# Patient Record
Sex: Female | Born: 1970 | Race: Black or African American | Hispanic: No | Marital: Married | Smoking: Never smoker
Health system: Southern US, Community
[De-identification: ages and names within clinical notes are randomized; demographics above are authoritative.]

## PROBLEM LIST (undated history)

## (undated) DIAGNOSIS — J302 Other seasonal allergic rhinitis: Secondary | ICD-10-CM

## (undated) DIAGNOSIS — E119 Type 2 diabetes mellitus without complications: Secondary | ICD-10-CM

## (undated) HISTORY — DX: Type 2 diabetes mellitus without complications: E11.9

---

## 2010-12-31 ENCOUNTER — Other Ambulatory Visit: Payer: Self-pay | Admitting: Family Medicine

## 2010-12-31 DIAGNOSIS — R1011 Right upper quadrant pain: Secondary | ICD-10-CM

## 2011-01-01 ENCOUNTER — Ambulatory Visit
Admission: RE | Admit: 2011-01-01 | Discharge: 2011-01-01 | Disposition: A | Discharge status: Home / Self Care | Payer: Managed Care, Other (non HMO) | Source: Ambulatory Visit | Attending: Family Medicine | Admitting: Family Medicine

## 2011-01-01 DIAGNOSIS — R1011 Right upper quadrant pain: Secondary | ICD-10-CM

## 2012-02-16 IMAGING — US US ABDOMEN COMPLETE
1 series · 14 of 25 positions shown · non-contrast
Comparison: None.

CLINICAL DATA: Right upper quadrant abdominal pain

COMPLETE ABDOMINAL ULTRASOUND

[Series 1: us abdomen complete · 0.32mm/px · 14 of 86 slices shown]
[im 1/86]
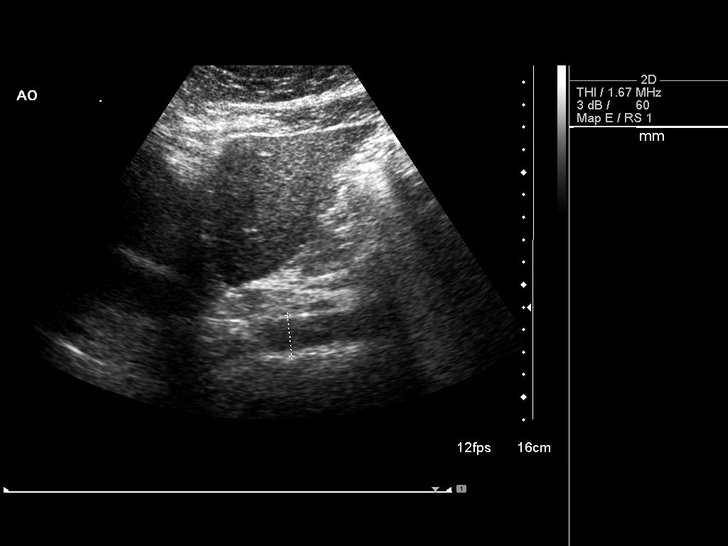
[im 8/86]
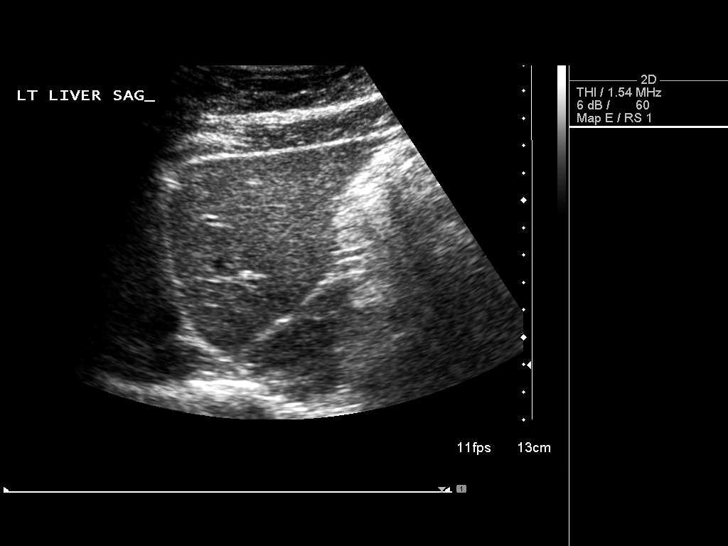
[im 15/86]
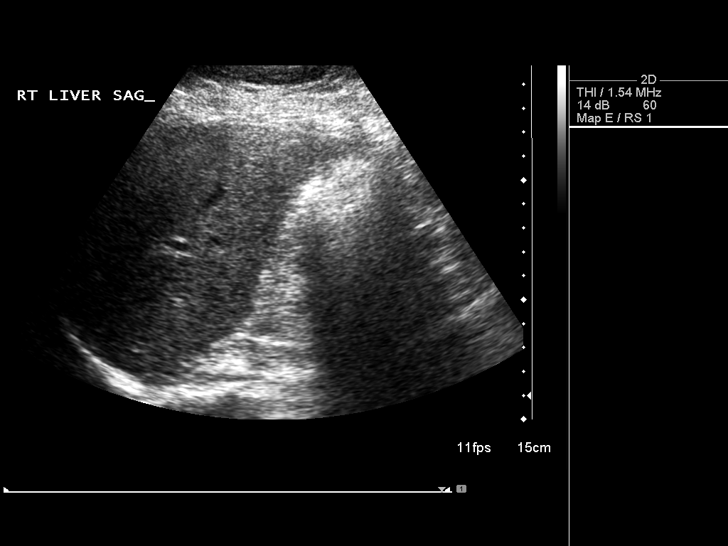
[im 22/86]
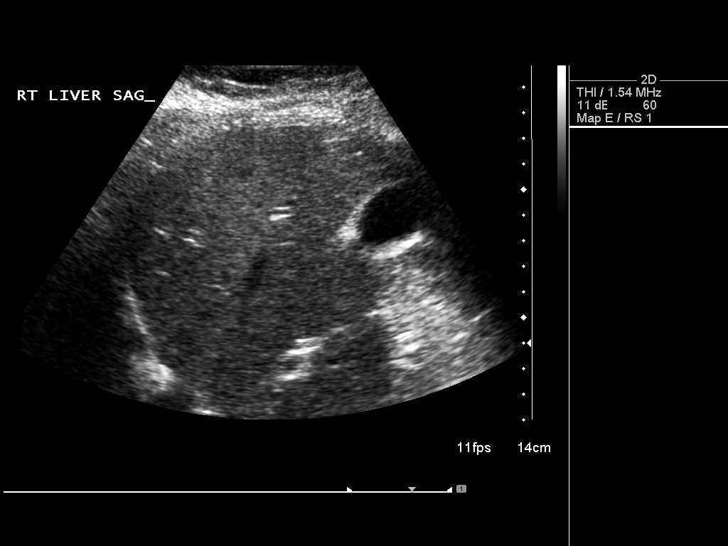
[im 29/86]
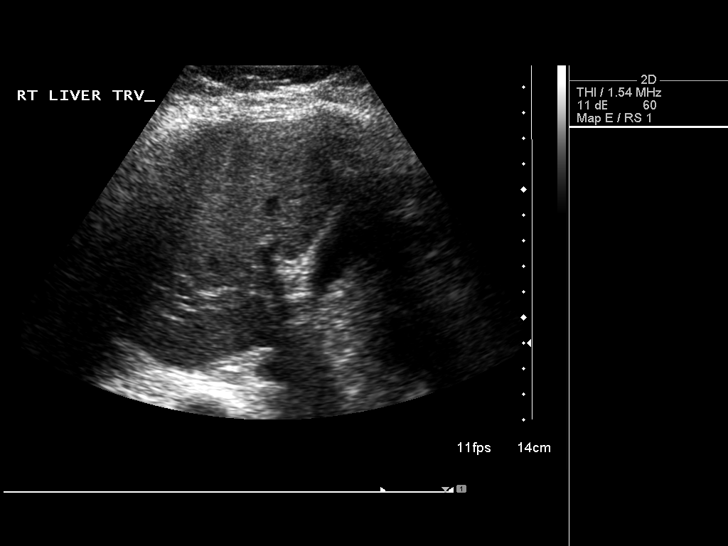
[im 32/86]
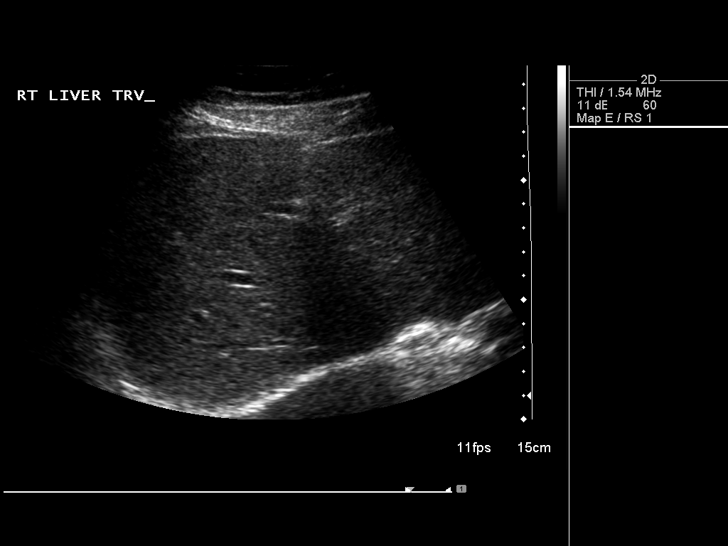
[im 39/86]
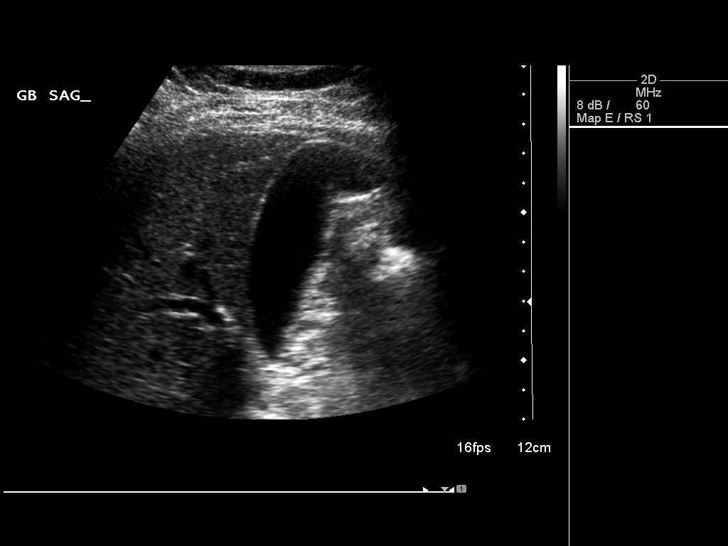
[im 47/86]
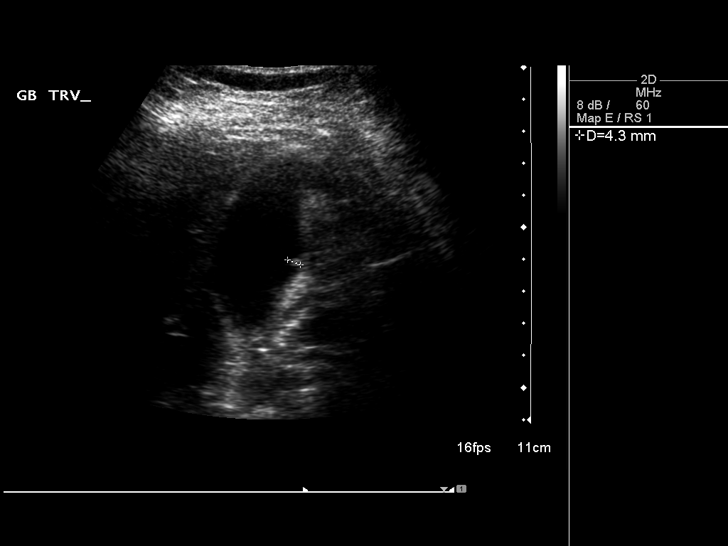
[im 54/86]
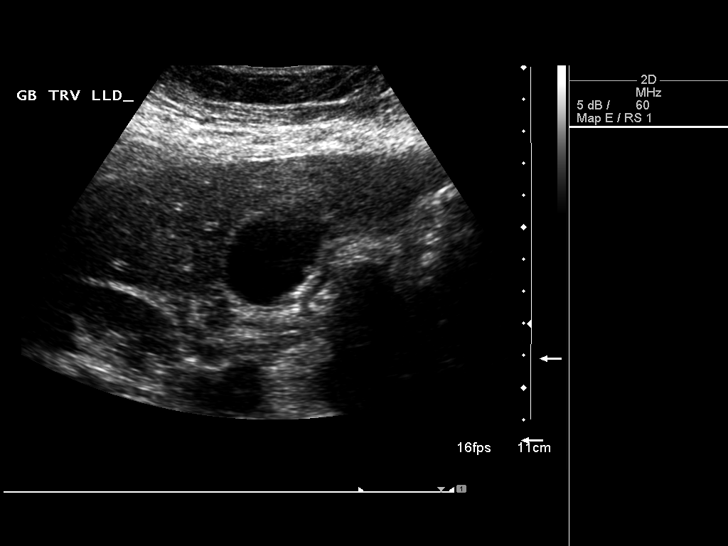
[im 57/86]
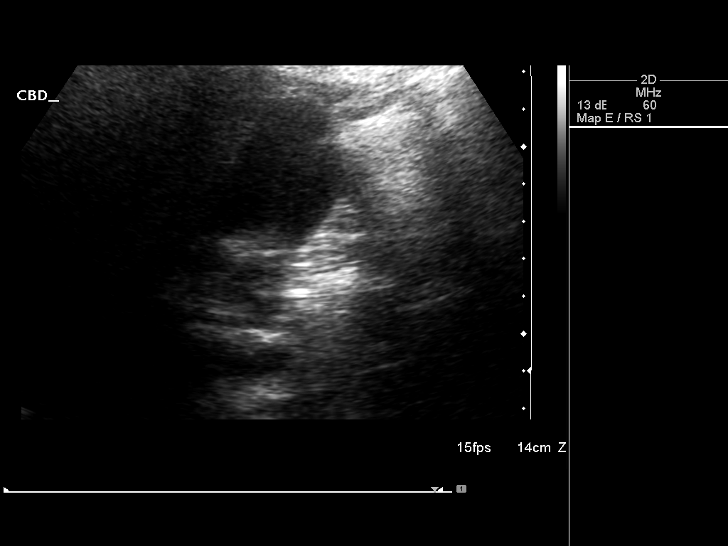
[im 64/86]
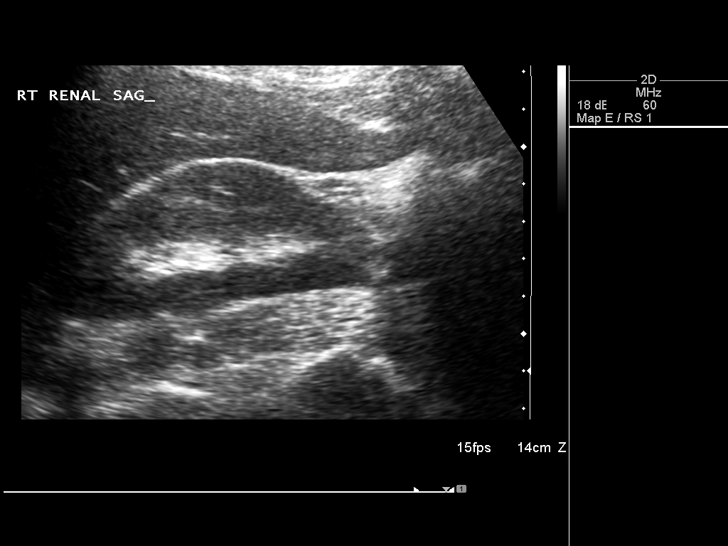
[im 71/86]
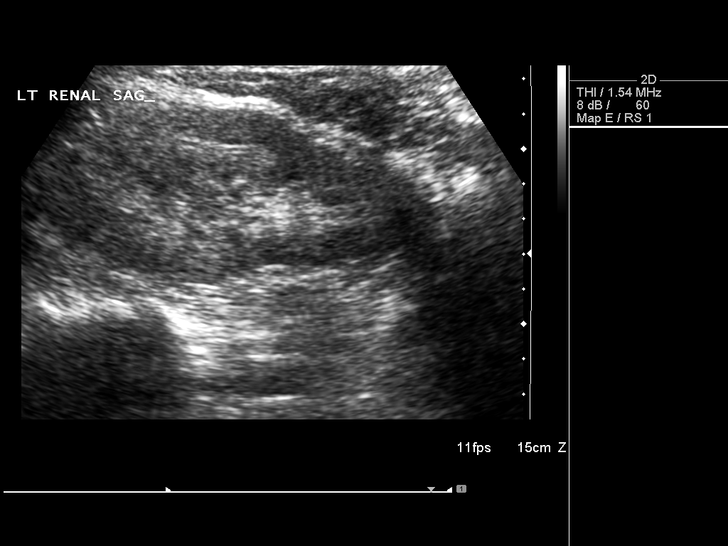
[im 78/86]
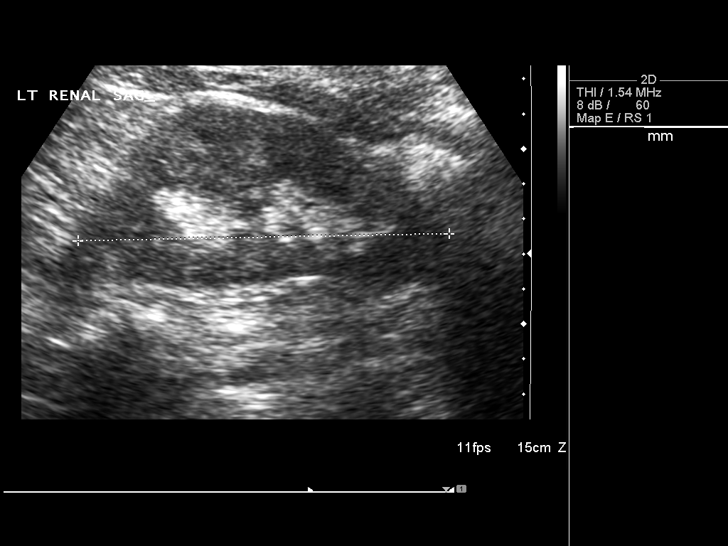
[im 86/86]
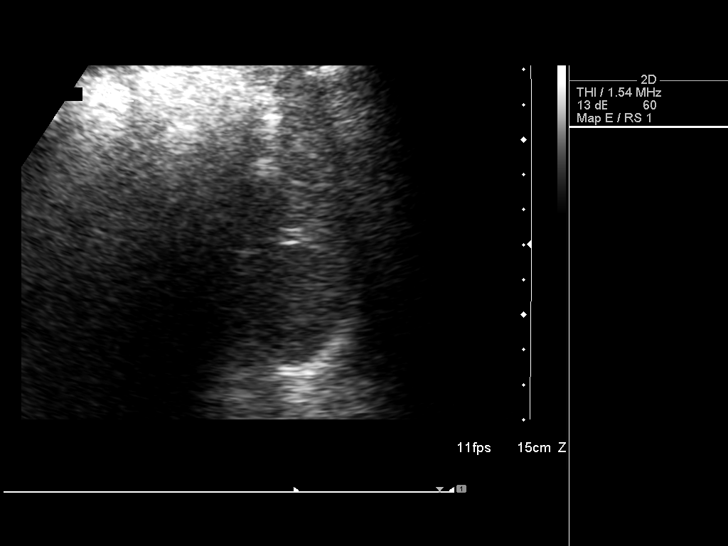

[14 of 25 positions shown; findings below may reference images not displayed]

FINDINGS: Gallbladder:  The gallbladder is visualized and there is a probable
5 mm polyp which does not move.  No definite gallstones are seen
and there is no pain over the gallbladder with compression.

Common bile duct:  The common bile duct is normal measuring 3.4 mm
in diameter.

Liver:  The liver has a normal echogenic pattern.  No ductal
dilatation is seen.

IVC:  Appears normal.

Pancreas:  The pancreas is largely obscured by bowel gas.

Spleen:  The spleen is normal measuring 3.4 cm sagittally.

Right Kidney:  No hydronephrosis is seen.  The right kidney
measures 9.9 cm sagittally.

Left Kidney:  No hydronephrosis.  The left kidney measures 10.3 cm.

Abdominal aorta:  The abdominal aorta is normal in caliber with the
proximal common iliac arteries obscured by bowel gas.
IMPRESSION: 1.  No gallstones.  5 mm gallbladder polyp.
2.  No ductal dilatation.
3.  The pancreas is obscured by bowel gas.

## 2012-11-30 ENCOUNTER — Other Ambulatory Visit: Payer: Self-pay

## 2012-11-30 DIAGNOSIS — Z1231 Encounter for screening mammogram for malignant neoplasm of breast: Secondary | ICD-10-CM

## 2012-12-21 ENCOUNTER — Ambulatory Visit
Admission: RE | Admit: 2012-12-21 | Discharge: 2012-12-21 | Disposition: A | Discharge status: Home / Self Care | Payer: Managed Care, Other (non HMO) | Source: Ambulatory Visit

## 2012-12-21 DIAGNOSIS — Z1231 Encounter for screening mammogram for malignant neoplasm of breast: Secondary | ICD-10-CM

## 2013-08-30 ENCOUNTER — Other Ambulatory Visit: Payer: Self-pay

## 2013-08-30 DIAGNOSIS — Z1231 Encounter for screening mammogram for malignant neoplasm of breast: Secondary | ICD-10-CM

## 2013-12-25 ENCOUNTER — Ambulatory Visit
Admission: RE | Admit: 2013-12-25 | Discharge: 2013-12-25 | Disposition: A | Discharge status: Home / Self Care | Payer: Managed Care, Other (non HMO) | Source: Ambulatory Visit

## 2013-12-25 ENCOUNTER — Encounter (INDEPENDENT_AMBULATORY_CARE_PROVIDER_SITE_OTHER): Payer: Self-pay

## 2013-12-25 DIAGNOSIS — Z1231 Encounter for screening mammogram for malignant neoplasm of breast: Secondary | ICD-10-CM

## 2014-01-15 ENCOUNTER — Encounter: Payer: Self-pay | Admitting: Podiatry

## 2014-01-15 ENCOUNTER — Ambulatory Visit (INDEPENDENT_AMBULATORY_CARE_PROVIDER_SITE_OTHER): Payer: Managed Care, Other (non HMO) | Admitting: Podiatry

## 2014-01-15 ENCOUNTER — Ambulatory Visit (INDEPENDENT_AMBULATORY_CARE_PROVIDER_SITE_OTHER): Payer: Managed Care, Other (non HMO)

## 2014-01-15 VITALS — BP 102/70 | HR 78 | Resp 13 | Ht 64.0 in | Wt 175.0 lb

## 2014-01-15 DIAGNOSIS — M722 Plantar fascial fibromatosis: Secondary | ICD-10-CM

## 2014-01-15 DIAGNOSIS — M79671 Pain in right foot: Secondary | ICD-10-CM

## 2014-01-15 MED ORDER — TRIAMCINOLONE ACETONIDE 10 MG/ML IJ SUSP
10.0000 mg | Freq: Once | INTRAMUSCULAR | Status: AC
  Administered 2014-01-15: 10 mg

## 2014-01-15 NOTE — Progress Notes (Signed)
   Subjective:    Patient ID: Sherry Schroeder, female    DOB: 01/16/1971, 43 y.o.   MRN: [id number]  HPI Comments: N heel pain and forefoot pain L right medial inferior arch and plantar 2-4th MPJ area D consistent since 2011 - heel pain     After a salsa class in high-heeled shoes 6 months ago - forefoot pain C heel - sharp pain     Forefoot - tenderness A heel hurts worse in the morning    Forefoot sore all day long T compression sock helps the heel pain and Advil for the forefoot soreness  Pt complains of cramping sensation for 1 year, in the dorsal left forefoot 2-4 MPJ, worse after rest.  Pt complains of pain in the left 2nd medial DPJ and 2nd medial toenail border on and off for years.   Foot Pain  Toe Pain    the patient describes having fasciitis some years ago and has an existing night splint but she still continues to wear.  The right heel is the most dominant pain to seeing her ability to stand and walk. The patient wears athletic style shoes on a regular basis and has a sedentary data entry job      Review of Systems  All other systems reviewed and are negative.      Objective:   Physical Exam Orientated 3  Vascular: DP and PT pulses 2/4 bilaterally Capillary reflex immediate bilaterally  Neurological: Ankle reflexes equal and reactive bilaterally   Dermatological: Texture and turgor within normal limits  Musculoskeletal: Exquisite palpable tenderness medial plantar fascial area right Mild palpable tenderness mid fascial band right No palpable lesions plantar fascial bands bilaterally Palpable tenderness plantar basis second MPJ right foot, without any palpable lesions There is no restriction or crepitus upon range of motion of ankle, subtalar, midtarsal, MPJ joints bilaterally  X-ray examination right foot  Intact bony structures without any fracture and/or dislocation noted  No deformities noted  Radiographic impression:  No acute bony abnormality  noted in the right foot       Assessment & Plan:   Assessment: Plantar fasciitis right Second MPJ capsulitis/metatarsalgia right  Plan: This patient is extremely uncomfortable at this time will provide maximum conservative therapy to help her reduce the primary plantar fascial pain. Offered patient Kenalog injection she verbally consents  Skin is prepped with alcohol and 10 mg of Kenalog mixed with 10 mg of plain Xylocaine and 2.5 mg of plain Marcaine were injected inferiorly he'll write for Kenalog injection #1  Digital scan obtained today for the indication of plantar fasciitis right and metatarsalgia right Semirigid full  length custom orthotic with metatarsal pads 2-4 bilaterally with Spenco top cover. Intrinsic forefoot and rear foot posts, bilaterally  Plantar fascial strap dispensed to wear and right foot and will have patient wear the plantar fasciitis strap as well as the foot orthotics  She will begin stretching discussed and information provided at discharge  We appoint patient upon receipt of custom foot orthotic

## 2014-01-15 NOTE — Patient Instructions (Signed)
Bent - Knee Calf Stretch  1) Stand an arm's length away from a wall. Place the palms of your hands on the wall. Step forward about 12 inches with the opposite foot.  2) Keeping toes pointed forward and both heels on the floor, bend both knees and lean forward. Hold this position for 60 seconds. Don't arch your back and don't hunch your shoulders.  3) Repeat this twice.  DO THIS STRETCHING TECHNIQUE 3 TIMES A DAY.   Stretching Exercises before Standing      Pull your toes up toward your nose and hold for 1 minute before standing.  A towel can assist with this exercise if you put the towel under the ball of your foot. This exercise reduces the intense    pain associated when changing from a seated to a standing position. This stretch can usually be the most beneficial if done before getting out of bed in the mornings. Plantar Fasciitis Plantar fasciitis is a common condition that causes foot pain. It is soreness (inflammation) of the band of tough fibrous tissue on the bottom of the foot that runs from the heel bone (calcaneus) to the ball of the foot. The cause of this soreness may be from excessive standing, poor fitting shoes, running on hard surfaces, being overweight, having an abnormal walk, or overuse (this is common in runners) of the painful foot or feet. It is also common in aerobic exercise dancers and ballet dancers. SYMPTOMS  Most people with plantar fasciitis complain of:  Severe pain in the morning on the bottom of their foot especially when taking the first steps out of bed. This pain recedes after a few minutes of walking.  Severe pain is experienced also during walking following a long period of inactivity.  Pain is worse when walking barefoot or up stairs DIAGNOSIS   Your caregiver will diagnose this condition by examining and feeling your foot.  Special tests such as X-rays of your foot, are usually not needed. PREVENTION   Consult a sports medicine professional  before beginning a new exercise program.  Walking programs offer a good workout. With walking there is a lower chance of overuse injuries common to runners. There is less impact and less jarring of the joints.  Begin all new exercise programs slowly. If problems or pain develop, decrease the amount of time or distance until you are at a comfortable level.  Wear good shoes and replace them regularly.  Stretch your foot and the heel cords at the back of the ankle (Achilles tendon) both before and after exercise.  Run or exercise on even surfaces that are not hard. For example, asphalt is better than pavement.  Do not run barefoot on hard surfaces.  If using a treadmill, vary the incline.  Do not continue to workout if you have foot or joint problems. Seek professional help if they do not improve. HOME CARE INSTRUCTIONS   Avoid activities that cause you pain until you recover.  Use ice or cold packs on the problem or painful areas after working out.  Only take over-the-counter or prescription medicines for pain, discomfort, or fever as directed by your caregiver.  Soft shoe inserts or athletic shoes with air or gel sole cushions may be helpful.  If problems continue or become more severe, consult a sports medicine caregiver or your own health care provider. Cortisone is a potent anti-inflammatory medication that may be injected into the painful area. You can discuss this treatment with your caregiver. MAKE   SURE YOU:   Understand these instructions.  Will watch your condition.  Will get help right away if you are not doing well or get worse. Document Released: 12/02/2000 Document Revised: 06/01/2011 Document Reviewed: 02/01/2008 Exit[employer] Patient Information 2015 [employer]. This information is not intended to replace advice given to you by your health care provider. Make sure you discuss any questions you have with your health care provider.  

## 2014-01-16 ENCOUNTER — Encounter: Payer: Self-pay | Admitting: Podiatry

## 2014-02-05 ENCOUNTER — Encounter: Payer: Self-pay | Admitting: Podiatry

## 2014-02-05 ENCOUNTER — Ambulatory Visit (INDEPENDENT_AMBULATORY_CARE_PROVIDER_SITE_OTHER): Payer: Managed Care, Other (non HMO) | Admitting: Podiatry

## 2014-02-05 VITALS — BP 110/70 | HR 74 | Resp 12

## 2014-02-05 DIAGNOSIS — M722 Plantar fascial fibromatosis: Secondary | ICD-10-CM

## 2014-02-05 NOTE — Patient Instructions (Signed)

## 2014-02-06 NOTE — Progress Notes (Signed)
Patient ID: Sherry Schroeder, female   DOB: 03/17/1971, 43 y.o.   MRN: [id number]  Subjective: This patient presents for dispensing of custom foot orthotics. She still has some right heel pain which is improved since the initial visit of 01/15/2014 as well as improving metatarsalgia right  Objective: Palpable tenderness medial plantar right heel without any palpable lesions Mild palpable tenderness plantar second MPJ right without any palpable lesions Custom foot orthotics contour satisfactorily  Assessment: Plantar fasciitis right Metatarsalgia right  Plan: Orthotics dispensed with wearing instruction provided Okay to continue wearing ankle stabilizer with custom orthotics  Reappoint 6 weeks

## 2014-03-19 ENCOUNTER — Ambulatory Visit (INDEPENDENT_AMBULATORY_CARE_PROVIDER_SITE_OTHER): Payer: Managed Care, Other (non HMO) | Admitting: Podiatry

## 2014-03-19 ENCOUNTER — Encounter: Payer: Self-pay | Admitting: Podiatry

## 2014-03-19 VITALS — BP 107/71 | HR 70 | Resp 12

## 2014-03-19 DIAGNOSIS — M722 Plantar fascial fibromatosis: Secondary | ICD-10-CM

## 2014-03-19 NOTE — Patient Instructions (Signed)
Continue to wear orthotics in lace up athletic style shoes on a regular basis If the right inside ankle pain increases return for further evaluation

## 2014-03-20 NOTE — Progress Notes (Signed)
Patient ID: Sherry Schroeder, Sherry Schroeder   DOB: 05/21/1970, 43 y.o.   MRN: [id number]  Subjective: As patient presents for follow-up care for plantar fasciitis and metatarsalgia right. Orthotics were dispensed on the visit of 02/05/2014. Patient wears the orthotics on an occasional basis with fasciitis strap. She is notice some reduction of pain in the inferior right heel and plantar right forefoot area.. She's also complaining of pain in the medial right ankle area   Objective: Upon weight-bearing patient is able to heel off unilaterally and bilaterally Mild palpable tenderness retro-medial malleolar area right without a palpable lesions Palpable tenderness medial plantar fascial area right without a palpable lesions Palpable tenderness plantar second MPJ right without any palpable lesions  Assessment: Plantar fasciitis right Metatarsalgia right Medial right ankle tendinopathy  Plan: Patient is advised to wear her orthotics in athletic style shoe on ongoing continuous basis as well as fasciitis strap. A note was given to her employer for to wear athletic style shoes  If the symptoms in the fascial area forefoot area or ankle area are not improving to patient's satisfaction return for further evaluation  Reappoint at patient's request

## 2015-02-04 ENCOUNTER — Telehealth: Payer: Self-pay | Admitting: *Deleted

## 2015-03-18 NOTE — Telephone Encounter (Signed)
Entered in error

## 2015-04-15 ENCOUNTER — Emergency Department (HOSPITAL_BASED_OUTPATIENT_CLINIC_OR_DEPARTMENT_OTHER)
Admission: EM | Admit: 2015-04-15 | Discharge: 2015-04-15 | Disposition: A | Discharge status: Home / Self Care | Payer: Managed Care, Other (non HMO) | Attending: Emergency Medicine | Admitting: Emergency Medicine

## 2015-04-15 ENCOUNTER — Encounter (HOSPITAL_BASED_OUTPATIENT_CLINIC_OR_DEPARTMENT_OTHER): Payer: Self-pay

## 2015-04-15 DIAGNOSIS — S199XXA Unspecified injury of neck, initial encounter: Secondary | ICD-10-CM | POA: Diagnosis not present

## 2015-04-15 DIAGNOSIS — Y998 Other external cause status: Secondary | ICD-10-CM | POA: Diagnosis not present

## 2015-04-15 DIAGNOSIS — S29002A Unspecified injury of muscle and tendon of back wall of thorax, initial encounter: Secondary | ICD-10-CM | POA: Insufficient documentation

## 2015-04-15 DIAGNOSIS — Y9389 Activity, other specified: Secondary | ICD-10-CM | POA: Insufficient documentation

## 2015-04-15 DIAGNOSIS — Y92410 Unspecified street and highway as the place of occurrence of the external cause: Secondary | ICD-10-CM | POA: Diagnosis not present

## 2015-04-15 DIAGNOSIS — S4990XA Unspecified injury of shoulder and upper arm, unspecified arm, initial encounter: Secondary | ICD-10-CM | POA: Diagnosis not present

## 2015-04-15 DIAGNOSIS — Z880 Allergy status to penicillin: Secondary | ICD-10-CM | POA: Insufficient documentation

## 2015-04-15 HISTORY — DX: Other seasonal allergic rhinitis: J30.2

## 2015-04-15 MED ORDER — IBUPROFEN 400 MG PO TABS
600.0000 mg | ORAL_TABLET | Freq: Once | ORAL | Status: AC
  Administered 2015-04-15: 600 mg via ORAL
  Filled 2015-04-15: qty 1

## 2015-04-15 MED ORDER — CYCLOBENZAPRINE HCL 10 MG PO TABS: 10.0000 mg | ORAL_TABLET | Freq: Two times a day (BID) | ORAL | Status: AC | PRN

## 2015-04-15 MED ORDER — IBUPROFEN 600 MG PO TABS: 600.0000 mg | ORAL_TABLET | Freq: Four times a day (QID) | ORAL | Status: AC | PRN

## 2015-04-15 MED FILL — IBUPROFEN 600 MG TABLET: 600 | 7 days supply | Qty: 30 | Fill #0

## 2015-04-15 MED FILL — CYCLOBENZAPRINE 10 MG TAB: 10 | 10 days supply | Qty: 20 | Fill #0
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
## 2015-06-04 ENCOUNTER — Other Ambulatory Visit: Payer: Self-pay

## 2015-06-04 DIAGNOSIS — Z1231 Encounter for screening mammogram for malignant neoplasm of breast: Secondary | ICD-10-CM

## 2015-06-19 ENCOUNTER — Ambulatory Visit
Admission: RE | Admit: 2015-06-19 | Discharge: 2015-06-19 | Disposition: A | Discharge status: Home / Self Care | Payer: Managed Care, Other (non HMO) | Source: Ambulatory Visit

## 2015-06-19 DIAGNOSIS — Z1231 Encounter for screening mammogram for malignant neoplasm of breast: Secondary | ICD-10-CM

## 2016-06-04 ENCOUNTER — Encounter: Payer: 59 | Attending: Family Medicine | Admitting: Dietician

## 2016-06-04 ENCOUNTER — Encounter: Payer: Self-pay | Admitting: Dietician

## 2016-06-04 DIAGNOSIS — Z713 Dietary counseling and surveillance: Secondary | ICD-10-CM | POA: Insufficient documentation

## 2016-06-04 DIAGNOSIS — E118 Type 2 diabetes mellitus with unspecified complications: Secondary | ICD-10-CM

## 2016-06-04 DIAGNOSIS — E119 Type 2 diabetes mellitus without complications: Secondary | ICD-10-CM | POA: Diagnosis not present

## 2016-06-04 NOTE — Progress Notes (Signed)

## 2016-06-11 ENCOUNTER — Encounter: Payer: 59 | Admitting: *Deleted

## 2016-06-11 DIAGNOSIS — E119 Type 2 diabetes mellitus without complications: Secondary | ICD-10-CM

## 2016-06-11 NOTE — Progress Notes (Signed)
Patient was seen on 06/11/2016 for the second of a series of three diabetes self-management courses at the [facility]. The following learning objectives were met by the patient during this class:   Describe the role of different macronutrients on glucose  Explain how carbohydrates affect blood glucose  State what foods contain the most carbohydrates  Demonstrate carbohydrate counting  Demonstrate how to read Nutrition Facts food label  Describe effects of various fats on heart health  Describe the importance of good nutrition for health and healthy eating strategies  Describe techniques for managing your shopping, cooking and meal planning  List strategies to follow meal plan when dining out  Describe the effects of alcohol on glucose and how to use it safely  Goals:  Follow Diabetes Meal Plan as instructed  Eat 3 meals and 2 snacks, every 3-5 hrs  Aim for carbohydrate intake of 45 grams carbohydrate/meal Aim for carbohydrate intake to 0-15 grams carbohydrate/snack Add lean protein foods to meals/snacks  Monitor glucose levels as instructed by your doctor   Follow-Up Plan:  Attend Core 3  Work towards following your personal food plan.

## 2016-06-18 ENCOUNTER — Encounter: Payer: 59 | Admitting: Dietician

## 2016-06-18 DIAGNOSIS — E119 Type 2 diabetes mellitus without complications: Secondary | ICD-10-CM | POA: Diagnosis not present

## 2016-06-18 DIAGNOSIS — E118 Type 2 diabetes mellitus with unspecified complications: Secondary | ICD-10-CM

## 2016-06-18 NOTE — Progress Notes (Signed)
Patient was seen on 06/18/16 for the third of a series of three diabetes self-management courses at the [facility].   Janene Madeira. State the amount of activity recommended for healthy living . Describe activities suitable for individual needs . Identify ways to regularly incorporate activity into daily life . Identify barriers to activity and ways to over come these barriers  Identify diabetes medications being personally used and their primary action for lowering glucose and possible side effects . Describe role of stress on blood glucose and develop strategies to address psychosocial issues . Identify diabetes complications and ways to prevent them  Explain how to manage diabetes during illness . Evaluate success in meeting personal goal . Establish 2-3 goals that they will plan to diligently work on until they return for the  3657-month follow-up visit  Goals:   I will count my carb choices at most meals and snacks  I will be active 10 minutes or more 5 times a week  I will take my diabetes medications as scheduled  I will eat less unhealthy fats by eating less bacon and pork  I will get my eyes checked  Your patient has identified these potential barriers to change:  Motivation Stress  Your patient has identified their diabetes self-care support plan as  Family Support  Plan:  Attend Support Group as desired
